# Patient Record
Sex: Male | Born: 1970 | Hispanic: Refuse to answer | Smoking: Never smoker
Health system: Southern US, Community
[De-identification: ages and names within clinical notes are randomized; demographics above are authoritative.]

## PROBLEM LIST (undated history)

## (undated) DIAGNOSIS — Z86718 Personal history of other venous thrombosis and embolism: Secondary | ICD-10-CM

## (undated) HISTORY — PX: BACK SURGERY: SHX140

---

## 2000-05-20 ENCOUNTER — Encounter: Admission: RE | Admit: 2000-05-20 | Discharge: 2000-05-20 | Payer: Self-pay | Admitting: Family Medicine

## 2000-05-20 ENCOUNTER — Encounter: Payer: Self-pay | Admitting: Family Medicine

## 2003-08-28 ENCOUNTER — Emergency Department (HOSPITAL_COMMUNITY): Admission: EM | Admit: 2003-08-28 | Discharge: 2003-08-28 | Payer: Self-pay | Admitting: Emergency Medicine

## 2007-03-06 ENCOUNTER — Emergency Department (HOSPITAL_COMMUNITY): Admission: EM | Admit: 2007-03-06 | Discharge: 2007-03-06 | Payer: Self-pay | Admitting: Emergency Medicine

## 2007-03-06 ENCOUNTER — Ambulatory Visit: Payer: Self-pay | Admitting: Psychiatry

## 2007-03-06 ENCOUNTER — Inpatient Hospital Stay (HOSPITAL_COMMUNITY): Admission: RE | Admit: 2007-03-06 | Discharge: 2007-03-09 | Payer: Self-pay | Admitting: Psychiatry

## 2010-01-12 ENCOUNTER — Emergency Department (HOSPITAL_COMMUNITY): Admission: EM | Admit: 2010-01-12 | Discharge: 2010-01-13 | Payer: Self-pay | Admitting: Emergency Medicine

## 2010-02-07 ENCOUNTER — Ambulatory Visit: Payer: Self-pay | Admitting: Diagnostic Radiology

## 2010-02-07 ENCOUNTER — Encounter: Payer: Self-pay | Admitting: Emergency Medicine

## 2010-02-08 ENCOUNTER — Ambulatory Visit: Payer: Self-pay | Admitting: Emergency Medicine

## 2010-02-08 ENCOUNTER — Inpatient Hospital Stay (HOSPITAL_COMMUNITY): Admission: EM | Admit: 2010-02-08 | Discharge: 2010-02-22 | Payer: Self-pay

## 2010-02-11 ENCOUNTER — Encounter: Payer: Self-pay | Admitting: Pulmonary Disease

## 2010-02-11 ENCOUNTER — Ambulatory Visit: Payer: Self-pay | Admitting: Vascular Surgery

## 2010-02-11 ENCOUNTER — Ambulatory Visit: Payer: Self-pay | Admitting: Infectious Diseases

## 2010-02-18 ENCOUNTER — Encounter (INDEPENDENT_AMBULATORY_CARE_PROVIDER_SITE_OTHER): Payer: Self-pay | Admitting: Internal Medicine

## 2010-03-14 ENCOUNTER — Inpatient Hospital Stay (HOSPITAL_COMMUNITY): Admission: AD | Admit: 2010-03-14 | Discharge: 2010-03-26 | Payer: Self-pay | Admitting: Plastic Surgery

## 2010-04-28 ENCOUNTER — Encounter (INDEPENDENT_AMBULATORY_CARE_PROVIDER_SITE_OTHER): Payer: Self-pay | Admitting: Nurse Practitioner

## 2010-05-02 ENCOUNTER — Encounter (INDEPENDENT_AMBULATORY_CARE_PROVIDER_SITE_OTHER): Payer: Self-pay | Admitting: Nurse Practitioner

## 2010-05-02 ENCOUNTER — Ambulatory Visit: Payer: Self-pay | Admitting: Internal Medicine

## 2010-05-04 ENCOUNTER — Encounter: Admission: RE | Admit: 2010-05-04 | Discharge: 2010-06-08 | Payer: Self-pay | Admitting: Orthopedic Surgery

## 2010-12-26 NOTE — Miscellaneous (Signed)
Summary: Medication update  Clinical Lists Changes  Spoke with pt's mother Arline Asp Pt had had several cancelled visits in this office an on 04/17/2010 he no showed Home health has been checking PT/INR and filling warfarin accordingly.  Medication were being filled in [facility] name because pt was to f/u in office with that provider. Home health ended this week Pt has completed his supply of medications [facility] 05[facility]01/2010 Reportedly pt is taking warfarin 5mg  - 3 tablets by mouth nightly [facility]ill [facility] for 15 tablets Advised mother that pt should keep this appt because technically he has not established with this office. He will ned to make a f/u appt with a provider Medications: Added new medication of WARFARIN SODIUM 5 MG TABS (WARFARIN SODIUM) 3 tablets by mouth nightly - Signed Rx of WARFARIN SODIUM 5 MG TABS (WARFARIN SODIUM) 3 tablets by mouth nightly;  #15 x 0;  Signed;  Entered by: Lehman Prom FNP;  Authorized by: Lehman Prom FNP;  Method used: Electronically to [facility]*, [address] [place], [place]  [address], Ph: [phone] or [phone], Fax: [phone]    Prescriptions: WARFARIN SODIUM 5 MG TABS (WARFARIN SODIUM) 3 tablets by mouth nightly  #15 x 0   Entered and Authorized by:   Lehman Prom FNP   Signed by:   Lehman Prom FNP on 04/28/2010   Method used:   Electronically to        [facility]* (retail)       [address] [place], [place]  [address]       Ph: [phone] or [phone]       Fax: [phone]   RxID:   [id number]

## 2010-12-26 NOTE — Letter (Signed)
Summary: ANTICOAGULATION DOSING  ANTICOAGULATION DOSING   Imported By: Arta Bruce 05/18/2010 11:22:13  _____________________________________________________________________  External Attachment:    Type:   Image     Comment:   External Document

## 2010-12-26 NOTE — Letter (Signed)
Summary: SOCIAL WOR CLINICAL ENCOUNTER  SOCIAL WOR CLINICAL ENCOUNTER   Imported By: Arta Bruce 05/26/2010 10:00:11  _____________________________________________________________________  External Attachment:    Type:   Image     Comment:   External Document

## 2011-02-13 LAB — DIFFERENTIAL
Basophils Absolute: 0 10*3/uL (ref 0.0–0.1)
Basophils Absolute: 0.1 10*3/uL (ref 0.0–0.1)
Basophils Absolute: 0.1 10*3/uL (ref 0.0–0.1)
Basophils Absolute: 0.1 10*3/uL (ref 0.0–0.1)
Basophils Absolute: 0.2 10*3/uL — ABNORMAL HIGH (ref 0.0–0.1)
Basophils Relative: 1 % (ref 0–1)
Basophils Relative: 1 % (ref 0–1)
Basophils Relative: 2 % — ABNORMAL HIGH (ref 0–1)
Eosinophils Absolute: 0.5 10*3/uL (ref 0.0–0.7)
Eosinophils Absolute: 0.5 10*3/uL (ref 0.0–0.7)
Eosinophils Absolute: 0.6 10*3/uL (ref 0.0–0.7)
Eosinophils Absolute: 0.6 10*3/uL (ref 0.0–0.7)
Eosinophils Absolute: 0.7 10*3/uL (ref 0.0–0.7)
Eosinophils Relative: 5 % (ref 0–5)
Eosinophils Relative: 6 % — ABNORMAL HIGH (ref 0–5)
Eosinophils Relative: 6 % — ABNORMAL HIGH (ref 0–5)
Eosinophils Relative: 6 % — ABNORMAL HIGH (ref 0–5)
Eosinophils Relative: 7 % — ABNORMAL HIGH (ref 0–5)
Lymphocytes Relative: 29 % (ref 12–46)
Lymphocytes Relative: 31 % (ref 12–46)
Lymphocytes Relative: 32 % (ref 12–46)
Lymphocytes Relative: 33 % (ref 12–46)
Lymphocytes Relative: 36 % (ref 12–46)
Lymphs Abs: 2.4 10*3/uL (ref 0.7–4.0)
Lymphs Abs: 2.6 10*3/uL (ref 0.7–4.0)
Lymphs Abs: 2.6 10*3/uL (ref 0.7–4.0)
Lymphs Abs: 2.6 10*3/uL (ref 0.7–4.0)
Lymphs Abs: 2.7 10*3/uL (ref 0.7–4.0)
Lymphs Abs: 3.2 10*3/uL (ref 0.7–4.0)
Monocytes Absolute: 0.6 10*3/uL (ref 0.1–1.0)
Monocytes Absolute: 0.5 10*3/uL (ref 0.1–1.0)
Monocytes Absolute: 0.6 10*3/uL (ref 0.1–1.0)
Monocytes Absolute: 0.6 10*3/uL (ref 0.1–1.0)
Monocytes Relative: 6 % (ref 3–12)
Monocytes Relative: 7 % (ref 3–12)
Monocytes Relative: 7 % (ref 3–12)
Monocytes Relative: 8 % (ref 3–12)
Neutro Abs: 4.5 10*3/uL (ref 1.7–7.7)
Neutro Abs: 3.8 10*3/uL (ref 1.7–7.7)
Neutro Abs: 4.4 10*3/uL (ref 1.7–7.7)
Neutro Abs: 4.8 10*3/uL (ref 1.7–7.7)
Neutro Abs: 5.5 10*3/uL (ref 1.7–7.7)
Neutrophils Relative %: 50 % (ref 43–77)
Neutrophils Relative %: 50 % (ref 43–77)
Neutrophils Relative %: 56 % (ref 43–77)
Neutrophils Relative %: 59 % (ref 43–77)

## 2011-02-13 LAB — ANAEROBIC CULTURE: Gram Stain: NONE SEEN

## 2011-02-13 LAB — CBC
HCT: 30.2 % — ABNORMAL LOW (ref 39.0–52.0)
HCT: 30.8 % — ABNORMAL LOW (ref 39.0–52.0)
HCT: 31.7 % — ABNORMAL LOW (ref 39.0–52.0)
HCT: 32.4 % — ABNORMAL LOW (ref 39.0–52.0)
HCT: 33.1 % — ABNORMAL LOW (ref 39.0–52.0)
HCT: 33.8 % — ABNORMAL LOW (ref 39.0–52.0)
HCT: 34.7 % — ABNORMAL LOW (ref 39.0–52.0)
Hemoglobin: 10.7 g/dL — ABNORMAL LOW (ref 13.0–17.0)
Hemoglobin: 10.9 g/dL — ABNORMAL LOW (ref 13.0–17.0)
Hemoglobin: 11 g/dL — ABNORMAL LOW (ref 13.0–17.0)
Hemoglobin: 11.3 g/dL — ABNORMAL LOW (ref 13.0–17.0)
Hemoglobin: 11.5 g/dL — ABNORMAL LOW (ref 13.0–17.0)
Hemoglobin: 11.8 g/dL — ABNORMAL LOW (ref 13.0–17.0)
MCHC: 33.9 g/dL (ref 30.0–36.0)
MCHC: 34 g/dL (ref 30.0–36.0)
MCHC: 34.2 g/dL (ref 30.0–36.0)
MCHC: 34.3 g/dL (ref 30.0–36.0)
MCHC: 34.9 g/dL (ref 30.0–36.0)
MCHC: 34.9 g/dL (ref 30.0–36.0)
MCV: 92.5 fL (ref 78.0–100.0)
MCV: 90.7 fL (ref 78.0–100.0)
MCV: 91.1 fL (ref 78.0–100.0)
MCV: 91.3 fL (ref 78.0–100.0)
MCV: 91.6 fL (ref 78.0–100.0)
MCV: 91.6 fL (ref 78.0–100.0)
MCV: 92 fL (ref 78.0–100.0)
MCV: 92.3 fL (ref 78.0–100.0)
Platelets: 261 10*3/uL (ref 150–400)
Platelets: 210 10*3/uL (ref 150–400)
Platelets: 216 10*3/uL (ref 150–400)
Platelets: 219 10*3/uL (ref 150–400)
Platelets: 221 10*3/uL (ref 150–400)
Platelets: 225 10*3/uL (ref 150–400)
Platelets: 232 10*3/uL (ref 150–400)
RBC: 3.61 MIL/uL — ABNORMAL LOW (ref 4.22–5.81)
RBC: 3.3 MIL/uL — ABNORMAL LOW (ref 4.22–5.81)
RBC: 3.38 MIL/uL — ABNORMAL LOW (ref 4.22–5.81)
RBC: 3.48 MIL/uL — ABNORMAL LOW (ref 4.22–5.81)
RBC: 3.53 MIL/uL — ABNORMAL LOW (ref 4.22–5.81)
RBC: 3.56 MIL/uL — ABNORMAL LOW (ref 4.22–5.81)
RBC: 3.71 MIL/uL — ABNORMAL LOW (ref 4.22–5.81)
RBC: 3.78 MIL/uL — ABNORMAL LOW (ref 4.22–5.81)
RDW: 13.9 % (ref 11.5–15.5)
RDW: 14.4 % (ref 11.5–15.5)
RDW: 14.6 % (ref 11.5–15.5)
RDW: 14.7 % (ref 11.5–15.5)
RDW: 14.9 % (ref 11.5–15.5)
RDW: 15 % (ref 11.5–15.5)
RDW: 15.4 % (ref 11.5–15.5)
RDW: 15.5 % (ref 11.5–15.5)
WBC: 9.1 10*3/uL (ref 4.0–10.5)
WBC: 7.7 10*3/uL (ref 4.0–10.5)
WBC: 7.9 10*3/uL (ref 4.0–10.5)
WBC: 8.3 10*3/uL (ref 4.0–10.5)
WBC: 8.3 10*3/uL (ref 4.0–10.5)
WBC: 8.5 10*3/uL (ref 4.0–10.5)
WBC: 9.3 10*3/uL (ref 4.0–10.5)

## 2011-02-13 LAB — BASIC METABOLIC PANEL
BUN: 2 mg/dL — ABNORMAL LOW (ref 6–23)
BUN: 4 mg/dL — ABNORMAL LOW (ref 6–23)
Calcium: 8.2 mg/dL — ABNORMAL LOW (ref 8.4–10.5)
Calcium: 8.6 mg/dL (ref 8.4–10.5)
Chloride: 106 mEq/L (ref 96–112)
Creatinine, Ser: 0.82 mg/dL (ref 0.4–1.5)
Creatinine, Ser: 0.88 mg/dL (ref 0.4–1.5)
GFR calc non Af Amer: >60 mL/min (ref >60)
GFR calc non Af Amer: >60 mL/min (ref >60)
Glucose, Bld: 97 mg/dL (ref 70–99)
Potassium: 3.6 mEq/L (ref 3.5–5.1)

## 2011-02-13 LAB — PROTIME-INR
INR: 1.04 (ref 0.00–1.49)
INR: 1.05 (ref 0.00–1.49)
INR: 1.13 (ref 0.00–1.49)
INR: 1.33 (ref 0.00–1.49)
Prothrombin Time: 13.6 seconds (ref 11.6–15.2)
Prothrombin Time: 15.2 seconds (ref 11.6–15.2)

## 2011-02-13 LAB — COMPREHENSIVE METABOLIC PANEL
ALT: 13 U/L (ref 0–53)
ALT: 15 U/L (ref 0–53)
AST: 14 U/L (ref 0–37)
AST: 17 U/L (ref 0–37)
Albumin: 2.2 g/dL — ABNORMAL LOW (ref 3.5–5.2)
Albumin: 2.8 g/dL — ABNORMAL LOW (ref 3.5–5.2)
Alkaline Phosphatase: 66 U/L (ref 39–117)
CO2: 31 mEq/L (ref 19–32)
Calcium: 8.7 mg/dL (ref 8.4–10.5)
Chloride: 105 mEq/L (ref 96–112)
GFR calc Af Amer: >60 mL/min (ref >60)
GFR calc Af Amer: >60 mL/min (ref >60)
GFR calc non Af Amer: >60 mL/min (ref >60)
Potassium: 3.7 mEq/L (ref 3.5–5.1)
Sodium: 137 mEq/L (ref 135–145)
Sodium: 138 mEq/L (ref 135–145)
Total Bilirubin: 0.4 mg/dL (ref 0.3–1.2)
Total Protein: 6.1 g/dL (ref 6.0–8.3)

## 2011-02-13 LAB — HEPARIN LEVEL (UNFRACTIONATED)
Heparin Unfractionated: 0.25 IU/mL — ABNORMAL LOW (ref 0.30–0.70)
Heparin Unfractionated: 0.14 IU/mL — ABNORMAL LOW (ref 0.30–0.70)
Heparin Unfractionated: 0.18 IU/mL — ABNORMAL LOW (ref 0.30–0.70)
Heparin Unfractionated: 0.24 IU/mL — ABNORMAL LOW (ref 0.30–0.70)
Heparin Unfractionated: 0.3 IU/mL (ref 0.30–0.70)
Heparin Unfractionated: 0.31 IU/mL (ref 0.30–0.70)
Heparin Unfractionated: 0.34 IU/mL (ref 0.30–0.70)
Heparin Unfractionated: 0.34 IU/mL (ref 0.30–0.70)
Heparin Unfractionated: 0.38 IU/mL (ref 0.30–0.70)
Heparin Unfractionated: 0.4 IU/mL (ref 0.30–0.70)
Heparin Unfractionated: 0.4 IU/mL (ref 0.30–0.70)

## 2011-02-13 LAB — APTT
aPTT: 29 seconds (ref 24–37)
aPTT: 30 seconds (ref 24–37)

## 2011-02-13 LAB — TISSUE CULTURE

## 2011-02-13 LAB — HEMOCCULT GUIAC POC 1CARD (OFFICE): Fecal Occult Bld: NEGATIVE

## 2011-02-14 LAB — URINALYSIS, ROUTINE W REFLEX MICROSCOPIC
Bilirubin Urine: NEGATIVE
Hgb urine dipstick: NEGATIVE
Nitrite: NEGATIVE
Protein, ur: NEGATIVE mg/dL
Specific Gravity, Urine: 1.03 (ref 1.005–1.030)
Urobilinogen, UA: 0.2 mg/dL (ref 0.0–1.0)

## 2011-02-19 LAB — CBC
HCT: 29 % — ABNORMAL LOW (ref 39.0–52.0)
HCT: 30.4 % — ABNORMAL LOW (ref 39.0–52.0)
HCT: 31.1 % — ABNORMAL LOW (ref 39.0–52.0)
HCT: 31.7 % — ABNORMAL LOW (ref 39.0–52.0)
HCT: 33.2 % — ABNORMAL LOW (ref 39.0–52.0)
HCT: 40.8 % (ref 39.0–52.0)
Hemoglobin: 10.1 g/dL — ABNORMAL LOW (ref 13.0–17.0)
Hemoglobin: 10.8 g/dL — ABNORMAL LOW (ref 13.0–17.0)
Hemoglobin: 11.2 g/dL — ABNORMAL LOW (ref 13.0–17.0)
Hemoglobin: 11.4 g/dL — ABNORMAL LOW (ref 13.0–17.0)
Hemoglobin: 14.3 g/dL (ref 13.0–17.0)
MCHC: 34.9 g/dL (ref 30.0–36.0)
MCHC: 35.1 g/dL (ref 30.0–36.0)
MCHC: 35.6 g/dL (ref 30.0–36.0)
MCV: 95.5 fL (ref 78.0–100.0)
MCV: 95.6 fL (ref 78.0–100.0)
MCV: 95.9 fL (ref 78.0–100.0)
MCV: 96.5 fL (ref 78.0–100.0)
MCV: 98.4 fL (ref 78.0–100.0)
Platelets: 121 10*3/uL — ABNORMAL LOW (ref 150–400)
Platelets: 121 10*3/uL — ABNORMAL LOW (ref 150–400)
Platelets: 16 10*3/uL — CL (ref 150–400)
Platelets: 182 10*3/uL (ref 150–400)
Platelets: 38 10*3/uL — ABNORMAL LOW (ref 150–400)
Platelets: 51 10*3/uL — ABNORMAL LOW (ref 150–400)
Platelets: 81 10*3/uL — ABNORMAL LOW (ref 150–400)
RBC: 3.11 MIL/uL — ABNORMAL LOW (ref 4.22–5.81)
RBC: 3.14 MIL/uL — ABNORMAL LOW (ref 4.22–5.81)
RBC: 3.32 MIL/uL — ABNORMAL LOW (ref 4.22–5.81)
RBC: 3.92 MIL/uL — ABNORMAL LOW (ref 4.22–5.81)
RBC: 4.27 MIL/uL (ref 4.22–5.81)
RDW: 13.2 % (ref 11.5–15.5)
RDW: 13.5 % (ref 11.5–15.5)
RDW: 13.9 % (ref 11.5–15.5)
RDW: 14.6 % (ref 11.5–15.5)
RDW: 14.7 % (ref 11.5–15.5)
RDW: 14.9 % (ref 11.5–15.5)
RDW: 15.2 % (ref 11.5–15.5)
WBC: 10.7 10*3/uL — ABNORMAL HIGH (ref 4.0–10.5)
WBC: 10.9 10*3/uL — ABNORMAL HIGH (ref 4.0–10.5)
WBC: 16.6 10*3/uL — ABNORMAL HIGH (ref 4.0–10.5)
WBC: 16.8 10*3/uL — ABNORMAL HIGH (ref 4.0–10.5)
WBC: 2.4 10*3/uL — ABNORMAL LOW (ref 4.0–10.5)
WBC: 9.3 10*3/uL (ref 4.0–10.5)

## 2011-02-19 LAB — PROTIME-INR
INR: 1.19 (ref 0.00–1.49)
INR: 1.56 — ABNORMAL HIGH (ref 0.00–1.49)
Prothrombin Time: 13.7 seconds (ref 11.6–15.2)
Prothrombin Time: 16.6 seconds — ABNORMAL HIGH (ref 11.6–15.2)
Prothrombin Time: 19.2 seconds — ABNORMAL HIGH (ref 11.6–15.2)

## 2011-02-19 LAB — BASIC METABOLIC PANEL
BUN: 12 mg/dL (ref 6–23)
BUN: 30 mg/dL — ABNORMAL HIGH (ref 6–23)
BUN: 31 mg/dL — ABNORMAL HIGH (ref 6–23)
BUN: 31 mg/dL — ABNORMAL HIGH (ref 6–23)
BUN: 33 mg/dL — ABNORMAL HIGH (ref 6–23)
BUN: 34 mg/dL — ABNORMAL HIGH (ref 6–23)
CO2: 18 mEq/L — ABNORMAL LOW (ref 19–32)
CO2: 18 mEq/L — ABNORMAL LOW (ref 19–32)
CO2: 21 mEq/L (ref 19–32)
CO2: 22 mEq/L (ref 19–32)
CO2: 24 mEq/L (ref 19–32)
Calcium: 5.9 mg/dL — CL (ref 8.4–10.5)
Calcium: 6 mg/dL — CL (ref 8.4–10.5)
Calcium: 7.8 mg/dL — ABNORMAL LOW (ref 8.4–10.5)
Calcium: 8.7 mg/dL (ref 8.4–10.5)
Calcium: 9.1 mg/dL (ref 8.4–10.5)
Chloride: 100 mEq/L (ref 96–112)
Chloride: 106 mEq/L (ref 96–112)
Chloride: 107 mEq/L (ref 96–112)
Chloride: 109 mEq/L (ref 96–112)
Chloride: 99 mEq/L (ref 96–112)
Creatinine, Ser: 1.31 mg/dL (ref 0.4–1.5)
Creatinine, Ser: 1.45 mg/dL (ref 0.4–1.5)
Creatinine, Ser: 1.47 mg/dL (ref 0.4–1.5)
Creatinine, Ser: 2.2 mg/dL — ABNORMAL HIGH (ref 0.4–1.5)
Creatinine, Ser: 2.31 mg/dL — ABNORMAL HIGH (ref 0.4–1.5)
Creatinine, Ser: 2.32 mg/dL — ABNORMAL HIGH (ref 0.4–1.5)
Creatinine, Ser: 3.1 mg/dL — ABNORMAL HIGH (ref 0.4–1.5)
GFR calc Af Amer: 38 mL/min — ABNORMAL LOW (ref >60)
GFR calc Af Amer: >60 mL/min (ref >60)
GFR calc Af Amer: >60 mL/min (ref >60)
GFR calc non Af Amer: 23 mL/min — ABNORMAL LOW (ref >60)
GFR calc non Af Amer: 32 mL/min — ABNORMAL LOW (ref >60)
GFR calc non Af Amer: 38 mL/min — ABNORMAL LOW (ref >60)
GFR calc non Af Amer: 54 mL/min — ABNORMAL LOW (ref >60)
GFR calc non Af Amer: 54 mL/min — ABNORMAL LOW (ref >60)
GFR calc non Af Amer: 54 mL/min — ABNORMAL LOW (ref >60)
GFR calc non Af Amer: >60 mL/min (ref >60)
GFR calc non Af Amer: >60 mL/min (ref >60)
GFR calc non Af Amer: >60 mL/min (ref >60)
GFR calc non Af Amer: >60 mL/min (ref >60)
Glucose, Bld: 117 mg/dL — ABNORMAL HIGH (ref 70–99)
Glucose, Bld: 120 mg/dL — ABNORMAL HIGH (ref 70–99)
Glucose, Bld: 128 mg/dL — ABNORMAL HIGH (ref 70–99)
Glucose, Bld: 140 mg/dL — ABNORMAL HIGH (ref 70–99)
Glucose, Bld: 154 mg/dL — ABNORMAL HIGH (ref 70–99)
Glucose, Bld: 156 mg/dL — ABNORMAL HIGH (ref 70–99)
Glucose, Bld: 80 mg/dL (ref 70–99)
Glucose, Bld: 84 mg/dL (ref 70–99)
Glucose, Bld: 85 mg/dL (ref 70–99)
Potassium: 2.8 mEq/L — ABNORMAL LOW (ref 3.5–5.1)
Potassium: 3 mEq/L — ABNORMAL LOW (ref 3.5–5.1)
Potassium: 3.4 mEq/L — ABNORMAL LOW (ref 3.5–5.1)
Potassium: 3.6 mEq/L (ref 3.5–5.1)
Potassium: 4 mEq/L (ref 3.5–5.1)
Potassium: 4 mEq/L (ref 3.5–5.1)
Sodium: 131 mEq/L — ABNORMAL LOW (ref 135–145)
Sodium: 133 mEq/L — ABNORMAL LOW (ref 135–145)
Sodium: 136 mEq/L (ref 135–145)
Sodium: 136 mEq/L (ref 135–145)
Sodium: 140 mEq/L (ref 135–145)
Sodium: 146 mEq/L — ABNORMAL HIGH (ref 135–145)

## 2011-02-19 LAB — CARBOXYHEMOGLOBIN
Carboxyhemoglobin: 1.3 % (ref 0.5–1.5)
Carboxyhemoglobin: 1.4 % (ref 0.5–1.5)
Carboxyhemoglobin: 1.4 % (ref 0.5–1.5)
Methemoglobin: 0.8 % (ref 0.0–1.5)
Methemoglobin: 0.9 % (ref 0.0–1.5)
O2 Saturation: 85.7 %
O2 Saturation: 89.1 %

## 2011-02-19 LAB — URINALYSIS, ROUTINE W REFLEX MICROSCOPIC
Bilirubin Urine: NEGATIVE
Glucose, UA: NEGATIVE mg/dL
Hgb urine dipstick: NEGATIVE
Ketones, ur: NEGATIVE mg/dL
Leukocytes, UA: NEGATIVE
Protein, ur: 30 mg/dL — AB
pH: 5.5 (ref 5.0–8.0)

## 2011-02-19 LAB — DIFFERENTIAL
Basophils Absolute: 0.1 10*3/uL (ref 0.0–0.1)
Basophils Relative: 1 % (ref 0–1)
Eosinophils Absolute: 0 10*3/uL (ref 0.0–0.7)
Lymphocytes Relative: 5 % — ABNORMAL LOW (ref 12–46)
Monocytes Absolute: 0.1 10*3/uL (ref 0.1–1.0)
Neutro Abs: 4.5 10*3/uL (ref 1.7–7.7)
Neutrophils Relative %: 91 % — ABNORMAL HIGH (ref 43–77)

## 2011-02-19 LAB — POCT I-STAT 3, ART BLOOD GAS (G3+)
Acid-base deficit: 1 mmol/L (ref 0.0–2.0)
Acid-base deficit: 3 mmol/L — ABNORMAL HIGH (ref 0.0–2.0)
Acid-base deficit: 3 mmol/L — ABNORMAL HIGH (ref 0.0–2.0)
Acid-base deficit: 7 mmol/L — ABNORMAL HIGH (ref 0.0–2.0)
Acid-base deficit: 9 mmol/L — ABNORMAL HIGH (ref 0.0–2.0)
Bicarbonate: 19.8 mEq/L — ABNORMAL LOW (ref 20.0–24.0)
Bicarbonate: 21.5 mEq/L (ref 20.0–24.0)
Bicarbonate: 21.7 mEq/L (ref 20.0–24.0)
Bicarbonate: 22.1 mEq/L (ref 20.0–24.0)
Bicarbonate: 34.6 mEq/L — ABNORMAL HIGH (ref 20.0–24.0)
O2 Saturation: 87 %
O2 Saturation: 92 %
O2 Saturation: 92 %
O2 Saturation: 94 %
O2 Saturation: 96 %
O2 Saturation: 97 %
O2 Saturation: 98 %
O2 Saturation: 98 %
Patient temperature: 102.4
Patient temperature: 104
Patient temperature: 99
Patient temperature: 99.1
Patient temperature: 99.5
TCO2: 17 mmol/L (ref 0–100)
TCO2: 19 mmol/L (ref 0–100)
TCO2: 20 mmol/L (ref 0–100)
TCO2: 21 mmol/L (ref 0–100)
TCO2: 21 mmol/L (ref 0–100)
TCO2: 22 mmol/L (ref 0–100)
TCO2: 23 mmol/L (ref 0–100)
TCO2: 29 mmol/L (ref 0–100)
pCO2 arterial: 32.1 mmHg — ABNORMAL LOW (ref 35.0–45.0)
pCO2 arterial: 32.2 mmHg — ABNORMAL LOW (ref 35.0–45.0)
pCO2 arterial: 34.4 mmHg — ABNORMAL LOW (ref 35.0–45.0)
pCO2 arterial: 36.2 mmHg (ref 35.0–45.0)
pCO2 arterial: 36.4 mmHg (ref 35.0–45.0)
pCO2 arterial: 37.7 mmHg (ref 35.0–45.0)
pCO2 arterial: 46.3 mmHg — ABNORMAL HIGH (ref 35.0–45.0)
pCO2 arterial: 53.8 mmHg — ABNORMAL HIGH (ref 35.0–45.0)
pCO2 arterial: 57.8 mmHg (ref 35.0–45.0)
pH, Arterial: 7.138 — CL (ref 7.350–7.450)
pH, Arterial: 7.168 — CL (ref 7.350–7.450)
pH, Arterial: 7.182 — CL (ref 7.350–7.450)
pH, Arterial: 7.251 — ABNORMAL LOW (ref 7.350–7.450)
pH, Arterial: 7.367 (ref 7.350–7.450)
pH, Arterial: 7.388 (ref 7.350–7.450)
pH, Arterial: 7.44 (ref 7.350–7.450)
pH, Arterial: 7.456 — ABNORMAL HIGH (ref 7.350–7.450)
pO2, Arterial: 101 mmHg — ABNORMAL HIGH (ref 80.0–100.0)
pO2, Arterial: 206 mmHg — ABNORMAL HIGH (ref 80.0–100.0)
pO2, Arterial: 49 mmHg — ABNORMAL LOW (ref 80.0–100.0)
pO2, Arterial: 50 mmHg — ABNORMAL LOW (ref 80.0–100.0)
pO2, Arterial: 70 mmHg — ABNORMAL LOW (ref 80.0–100.0)
pO2, Arterial: 89 mmHg (ref 80.0–100.0)

## 2011-02-19 LAB — PHOSPHORUS
Phosphorus: 3.3 mg/dL (ref 2.3–4.6)
Phosphorus: <1 mg/dL — CL (ref 2.3–4.6)

## 2011-02-19 LAB — PROTEIN S ACTIVITY: Protein S Activity: 78 % (ref 69–129)

## 2011-02-19 LAB — COMPREHENSIVE METABOLIC PANEL
ALT: 126 U/L — ABNORMAL HIGH (ref 0–53)
ALT: 46 U/L (ref 0–53)
ALT: 65 U/L — ABNORMAL HIGH (ref 0–53)
ALT: 89 U/L — ABNORMAL HIGH (ref 0–53)
AST: 39 U/L — ABNORMAL HIGH (ref 0–37)
AST: 74 U/L — ABNORMAL HIGH (ref 0–37)
Albumin: 1.8 g/dL — ABNORMAL LOW (ref 3.5–5.2)
Albumin: 2.1 g/dL — ABNORMAL LOW (ref 3.5–5.2)
Albumin: 2.4 g/dL — ABNORMAL LOW (ref 3.5–5.2)
Alkaline Phosphatase: 103 U/L (ref 39–117)
Alkaline Phosphatase: 12 U/L — ABNORMAL LOW (ref 39–117)
Alkaline Phosphatase: 50 U/L (ref 39–117)
BUN: 12 mg/dL (ref 6–23)
BUN: 28 mg/dL — ABNORMAL HIGH (ref 6–23)
BUN: 40 mg/dL — ABNORMAL HIGH (ref 6–23)
CO2: 14 mEq/L — ABNORMAL LOW (ref 19–32)
CO2: 20 mEq/L (ref 19–32)
CO2: 22 mEq/L (ref 19–32)
CO2: 24 mEq/L (ref 19–32)
Calcium: 6 mg/dL — CL (ref 8.4–10.5)
Calcium: 6.4 mg/dL — CL (ref 8.4–10.5)
Calcium: 8.4 mg/dL (ref 8.4–10.5)
Chloride: 108 mEq/L (ref 96–112)
Chloride: 109 mEq/L (ref 96–112)
Chloride: 98 mEq/L (ref 96–112)
Creatinine, Ser: 1.02 mg/dL (ref 0.4–1.5)
Creatinine, Ser: 2.41 mg/dL — ABNORMAL HIGH (ref 0.4–1.5)
GFR calc Af Amer: 36 mL/min — ABNORMAL LOW (ref >60)
GFR calc Af Amer: 38 mL/min — ABNORMAL LOW (ref >60)
GFR calc Af Amer: >60 mL/min (ref >60)
GFR calc non Af Amer: 30 mL/min — ABNORMAL LOW (ref >60)
GFR calc non Af Amer: 30 mL/min — ABNORMAL LOW (ref >60)
GFR calc non Af Amer: >60 mL/min (ref >60)
Glucose, Bld: 140 mg/dL — ABNORMAL HIGH (ref 70–99)
Glucose, Bld: 70 mg/dL (ref 70–99)
Glucose, Bld: 80 mg/dL (ref 70–99)
Potassium: 2.7 mEq/L — CL (ref 3.5–5.1)
Potassium: 3.2 mEq/L — ABNORMAL LOW (ref 3.5–5.1)
Potassium: 3.4 mEq/L — ABNORMAL LOW (ref 3.5–5.1)
Sodium: 130 mEq/L — ABNORMAL LOW (ref 135–145)
Sodium: 131 mEq/L — ABNORMAL LOW (ref 135–145)
Sodium: 134 mEq/L — ABNORMAL LOW (ref 135–145)
Sodium: 135 mEq/L (ref 135–145)
Total Bilirubin: 1.2 mg/dL (ref 0.3–1.2)
Total Bilirubin: 1.3 mg/dL — ABNORMAL HIGH (ref 0.3–1.2)
Total Bilirubin: 1.9 mg/dL — ABNORMAL HIGH (ref 0.3–1.2)
Total Protein: 4.2 g/dL — ABNORMAL LOW (ref 6.0–8.3)
Total Protein: 4.5 g/dL — ABNORMAL LOW (ref 6.0–8.3)
Total Protein: 4.8 g/dL — ABNORMAL LOW (ref 6.0–8.3)
Total Protein: 4.8 g/dL — ABNORMAL LOW (ref 6.0–8.3)
Total Protein: 5.5 g/dL — ABNORMAL LOW (ref 6.0–8.3)

## 2011-02-19 LAB — CK TOTAL AND CKMB (NOT AT ARMC)
CK, MB: 1.2 ng/mL (ref 0.3–4.0)
Relative Index: INVALID (ref 0.0–2.5)
Total CK: 98 U/L (ref 7–232)

## 2011-02-19 LAB — TYPE AND SCREEN
ABO/RH(D): O POS
Antibody Screen: NEGATIVE

## 2011-02-19 LAB — CULTURE, BLOOD (ROUTINE X 2)
Culture: NO GROWTH
Culture: NO GROWTH

## 2011-02-19 LAB — URINE MICROSCOPIC-ADD ON

## 2011-02-19 LAB — LACTIC ACID, PLASMA
Lactic Acid, Venous: 2.8 mmol/L — ABNORMAL HIGH (ref 0.5–2.2)
Lactic Acid, Venous: 2.8 mmol/L — ABNORMAL HIGH (ref 0.5–2.2)
Lactic Acid, Venous: 5.4 mmol/L — ABNORMAL HIGH (ref 0.5–2.2)

## 2011-02-19 LAB — LUPUS ANTICOAGULANT PANEL
DRVVT: 50.4 secs — ABNORMAL HIGH (ref 36.2–44.3)
PTT Lupus Anticoagulant: 43 secs (ref 32.0–43.4)

## 2011-02-19 LAB — HEPATIC FUNCTION PANEL
Albumin: 4.3 g/dL (ref 3.5–5.2)
Alkaline Phosphatase: 41 U/L (ref 39–117)
Indirect Bilirubin: 1.4 mg/dL — ABNORMAL HIGH (ref 0.3–0.9)
Total Bilirubin: 1.5 mg/dL — ABNORMAL HIGH (ref 0.3–1.2)
Total Protein: 7.4 g/dL (ref 6.0–8.3)

## 2011-02-19 LAB — CARDIOLIPIN ANTIBODIES, IGG, IGM, IGA
Anticardiolipin IgA: 21 APL U/mL (ref <22)
Anticardiolipin IgG: 14 GPL U/mL (ref <23)

## 2011-02-19 LAB — GLUCOSE, CAPILLARY
Glucose-Capillary: 109 mg/dL — ABNORMAL HIGH (ref 70–99)
Glucose-Capillary: 122 mg/dL — ABNORMAL HIGH (ref 70–99)
Glucose-Capillary: 139 mg/dL — ABNORMAL HIGH (ref 70–99)

## 2011-02-19 LAB — CULTURE, RESPIRATORY W GRAM STAIN

## 2011-02-19 LAB — URINE CULTURE: Culture: NO GROWTH

## 2011-02-19 LAB — PREPARE PLATELETS

## 2011-02-19 LAB — HIV-1 RNA QUANT-NO REFLEX-BLD
HIV 1 RNA Quant: <48 copies/mL (ref <48)
HIV-1 RNA Quant, Log: <1.68 {Log} (ref <1.68)

## 2011-02-19 LAB — DIC (DISSEMINATED INTRAVASCULAR COAGULATION)PANEL
D-Dimer, Quant: >20 ug/mL-FEU — ABNORMAL HIGH (ref 0.00–0.48)
Platelets: 105 10*3/uL — ABNORMAL LOW (ref 150–400)
aPTT: 46 seconds — ABNORMAL HIGH (ref 24–37)

## 2011-02-19 LAB — MAGNESIUM
Magnesium: 0.8 mg/dL — CL (ref 1.5–2.5)
Magnesium: 1.5 mg/dL (ref 1.5–2.5)
Magnesium: 1.8 mg/dL (ref 1.5–2.5)
Magnesium: 2.1 mg/dL (ref 1.5–2.5)

## 2011-02-19 LAB — CK: Total CK: 299 U/L — ABNORMAL HIGH (ref 7–232)

## 2011-02-19 LAB — PROTEIN C ACTIVITY: Protein C Activity: 106 % (ref 75–133)

## 2011-02-19 LAB — CALCIUM, IONIZED
Calcium, Ion: 0.97 mmol/L — ABNORMAL LOW (ref 1.12–1.32)
Calcium, Ion: 1.06 mmol/L — ABNORMAL LOW (ref 1.12–1.32)

## 2011-02-19 LAB — ABO/RH: ABO/RH(D): O POS

## 2011-02-19 LAB — APTT: aPTT: 45 seconds — ABNORMAL HIGH (ref 24–37)

## 2011-02-19 LAB — FACTOR 5 LEIDEN

## 2011-02-19 LAB — HOMOCYSTEINE: Homocysteine: 6.6 umol/L (ref 4.0–15.4)

## 2011-02-19 LAB — PROTHROMBIN GENE MUTATION

## 2011-02-28 ENCOUNTER — Emergency Department (HOSPITAL_COMMUNITY)
Admission: EM | Admit: 2011-02-28 | Discharge: 2011-03-01 | Disposition: A | Discharge status: Home / Self Care | Payer: Self-pay | Attending: Emergency Medicine | Admitting: Emergency Medicine

## 2011-02-28 DIAGNOSIS — R45851 Suicidal ideations: Secondary | ICD-10-CM | POA: Insufficient documentation

## 2011-02-28 DIAGNOSIS — F1010 Alcohol abuse, uncomplicated: Secondary | ICD-10-CM | POA: Insufficient documentation

## 2011-02-28 LAB — CBC
HCT: 48.3 % (ref 39.0–52.0)
MCHC: 34.2 g/dL (ref 30.0–36.0)
MCV: 93.2 fL (ref 78.0–100.0)
RDW: 13.3 % (ref 11.5–15.5)

## 2011-02-28 LAB — ETHANOL
Alcohol, Ethyl (B): 210 mg/dL — ABNORMAL HIGH (ref 0–10)
Alcohol, Ethyl (B): 43 mg/dL — ABNORMAL HIGH (ref 0–10)

## 2011-02-28 LAB — DIFFERENTIAL
Basophils Absolute: 0 10*3/uL (ref 0.0–0.1)
Eosinophils Absolute: 0.1 10*3/uL (ref 0.0–0.7)
Eosinophils Relative: 1 % (ref 0–5)
Lymphocytes Relative: 37 % (ref 12–46)
Lymphs Abs: 3.3 10*3/uL (ref 0.7–4.0)
Monocytes Absolute: 0.4 10*3/uL (ref 0.1–1.0)

## 2011-02-28 LAB — BASIC METABOLIC PANEL
GFR calc non Af Amer: >60 mL/min (ref >60)
Potassium: 4.1 mEq/L (ref 3.5–5.1)
Sodium: 136 mEq/L (ref 135–145)

## 2011-02-28 LAB — RAPID URINE DRUG SCREEN, HOSP PERFORMED: Tetrahydrocannabinol: NOT DETECTED

## 2011-03-04 ENCOUNTER — Emergency Department (HOSPITAL_COMMUNITY)
Admission: EM | Admit: 2011-03-04 | Discharge: 2011-03-04 | Disposition: A | Discharge status: Home / Self Care | Payer: Self-pay | Attending: Emergency Medicine | Admitting: Emergency Medicine

## 2011-03-04 ENCOUNTER — Emergency Department (HOSPITAL_COMMUNITY): Payer: Self-pay

## 2011-03-04 DIAGNOSIS — Z86718 Personal history of other venous thrombosis and embolism: Secondary | ICD-10-CM | POA: Insufficient documentation

## 2011-03-04 DIAGNOSIS — R0602 Shortness of breath: Secondary | ICD-10-CM | POA: Insufficient documentation

## 2011-03-04 DIAGNOSIS — M79609 Pain in unspecified limb: Secondary | ICD-10-CM

## 2011-03-04 LAB — DIFFERENTIAL
Eosinophils Absolute: 0.2 10*3/uL (ref 0.0–0.7)
Lymphocytes Relative: 28 % (ref 12–46)
Lymphs Abs: 1.5 10*3/uL (ref 0.7–4.0)
Neutrophils Relative %: 61 % (ref 43–77)

## 2011-03-04 LAB — APTT: aPTT: 27 seconds (ref 24–37)

## 2011-03-04 LAB — CBC
HCT: 42.3 % (ref 39.0–52.0)
Hemoglobin: 14.8 g/dL (ref 13.0–17.0)
MCH: 32.5 pg (ref 26.0–34.0)
MCHC: 35 g/dL (ref 30.0–36.0)
MCV: 93 fL (ref 78.0–100.0)
Platelets: 149 10*3/uL — ABNORMAL LOW (ref 150–400)
RBC: 4.55 MIL/uL (ref 4.22–5.81)
RDW: 13.2 % (ref 11.5–15.5)
WBC: 5.3 10*3/uL (ref 4.0–10.5)

## 2011-03-04 LAB — POCT I-STAT, CHEM 8
BUN: 11 mg/dL (ref 6–23)
Calcium, Ion: 1.21 mmol/L (ref 1.12–1.32)
Chloride: 108 mEq/L (ref 96–112)
Creatinine, Ser: 1.2 mg/dL (ref 0.4–1.5)
Glucose, Bld: 75 mg/dL (ref 70–99)
HCT: 44 % (ref 39.0–52.0)
Hemoglobin: 15 g/dL (ref 13.0–17.0)
Potassium: 3.8 mEq/L (ref 3.5–5.1)
Sodium: 141 mEq/L (ref 135–145)
TCO2: 25 mmol/L (ref 0–100)

## 2011-03-04 MED ORDER — IOHEXOL 300 MG/ML  SOLN: 100.0000 mL | Freq: Once | INTRAMUSCULAR | Status: DC | PRN

## 2011-04-05 IMAGING — CR DG CHEST 1V PORT
1 series · 1 of 1 positions shown · non-contrast
Comparison: 0715 hours the same day and earlier.

CLINICAL DATA: 38-year-old male with shock.  Shortness of breath.

PORTABLE CHEST - 1 VIEW

[view not recorded]
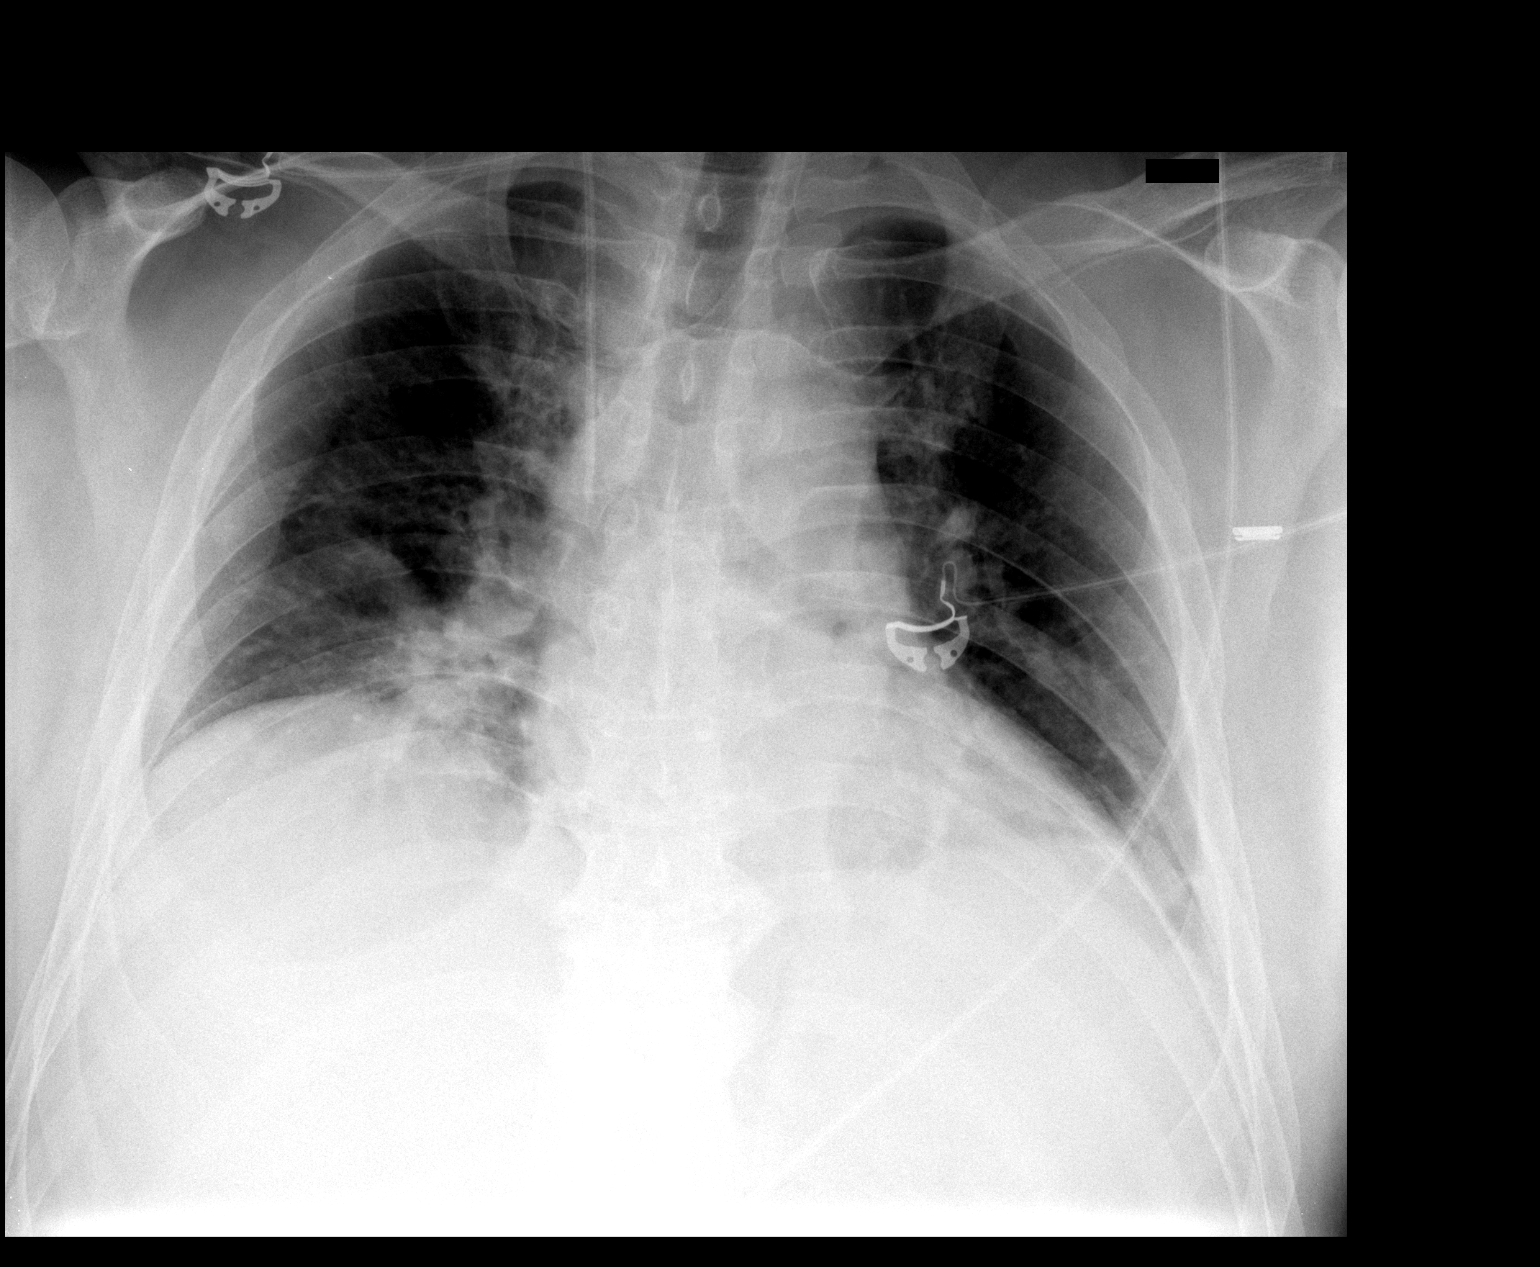

[1 of 1 positions shown; findings below may reference images not displayed]

FINDINGS: Portable semi upright AP view 1100 hours.  Stable right
IJ catheter.  Some interval improvement in lung volumes.  Continued
patchy bibasilar opacity.  No pneumothorax or large effusion. No
pulmonary edema.  Stable cardiac size and mediastinal contours.
Tubing is looped over the thoracic inlet, presumably external.
IMPRESSION: Mildly improved lung volumes with ongoing patchy bibasilar opacity.

## 2011-04-05 IMAGING — CR DG CHEST 1V PORT
1 series · 1 of 1 positions shown · non-contrast
Comparison: 02/08/2010 and earlier.

CLINICAL DATA: 38-year-old male with sepsis, shock.

PORTABLE CHEST - 1 VIEW

[view not recorded]
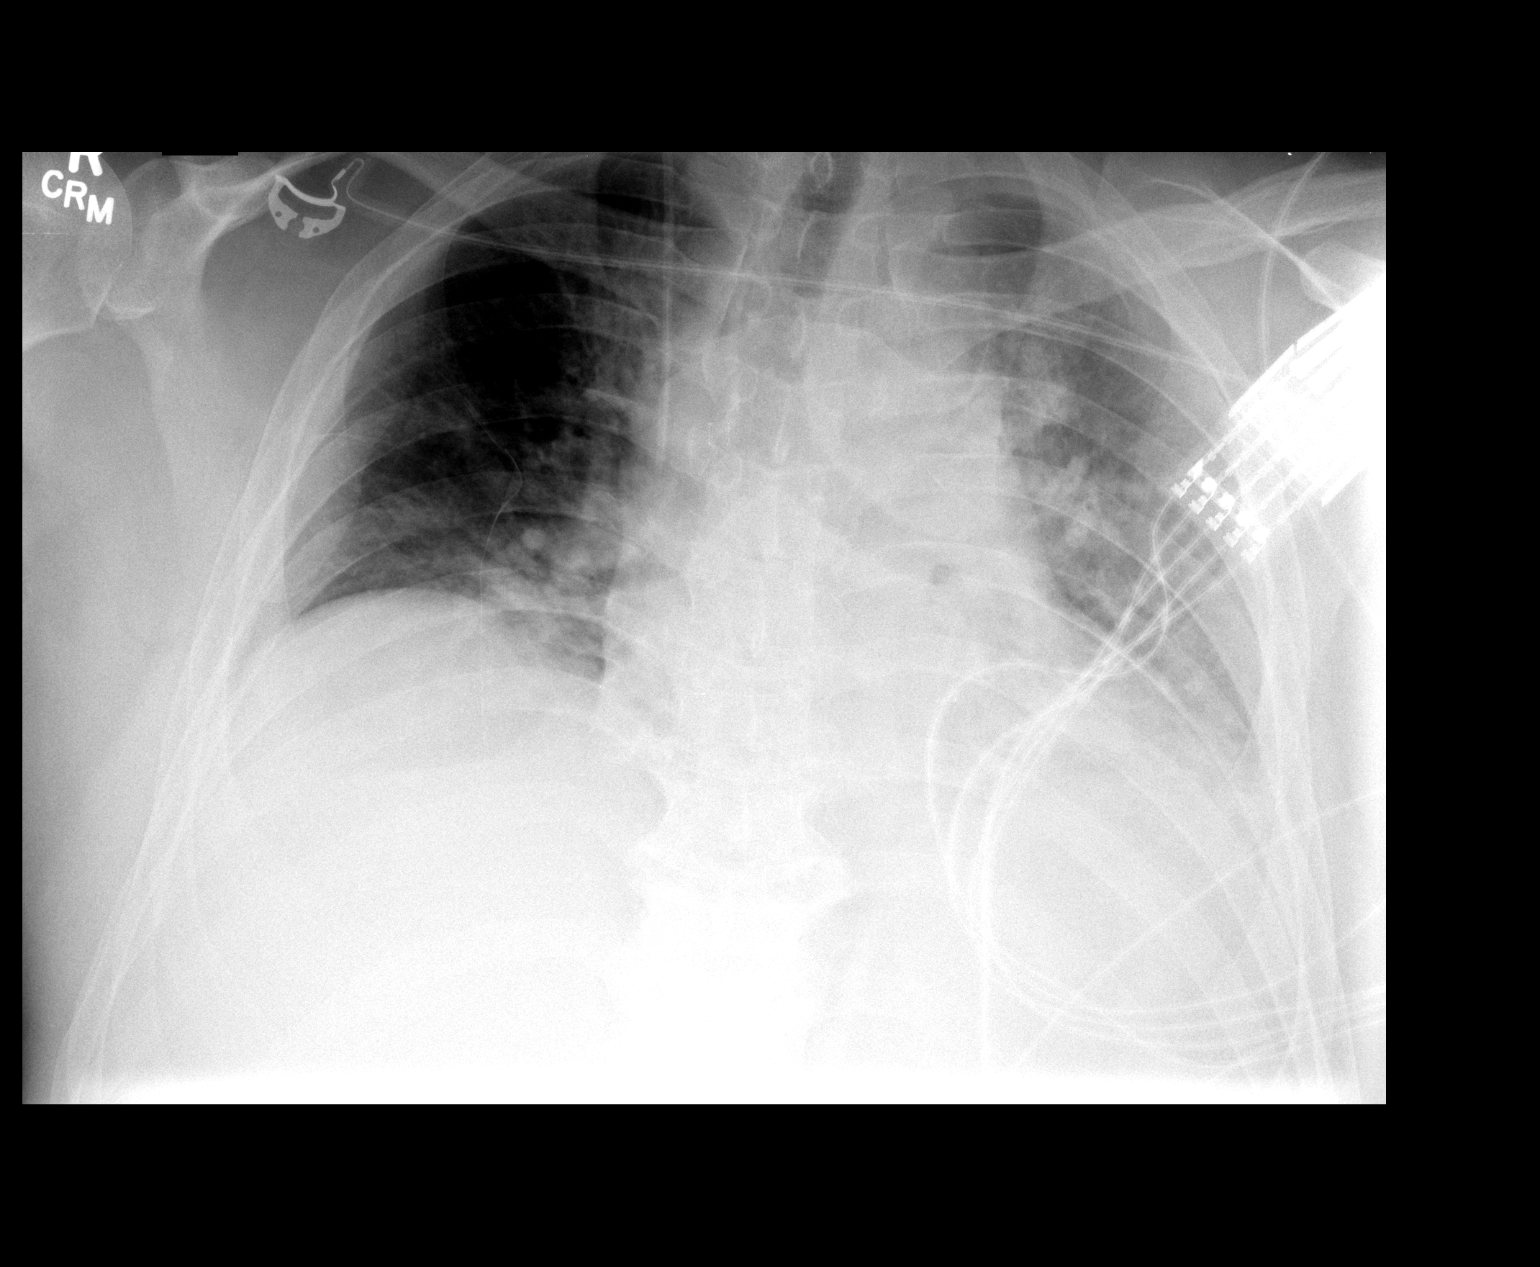

[1 of 1 positions shown; findings below may reference images not displayed]

FINDINGS: Portable semi upright AP view 5900 hours.  Stable right
IJ catheter.  Lower lung volumes and decreased bibasilar
ventilation.  No pneumothorax, pulmonary edema or large effusion.
Stable cardiac size and mediastinal contours.
IMPRESSION: Lower lung volumes and decreased bibasilar ventilation.

## 2011-04-06 IMAGING — CR DG CHEST 1V PORT
1 series · 1 of 1 positions shown · non-contrast
Comparison: Same day

CLINICAL DATA: Endotracheal placement.

PORTABLE CHEST - 1 VIEW

[view not recorded]
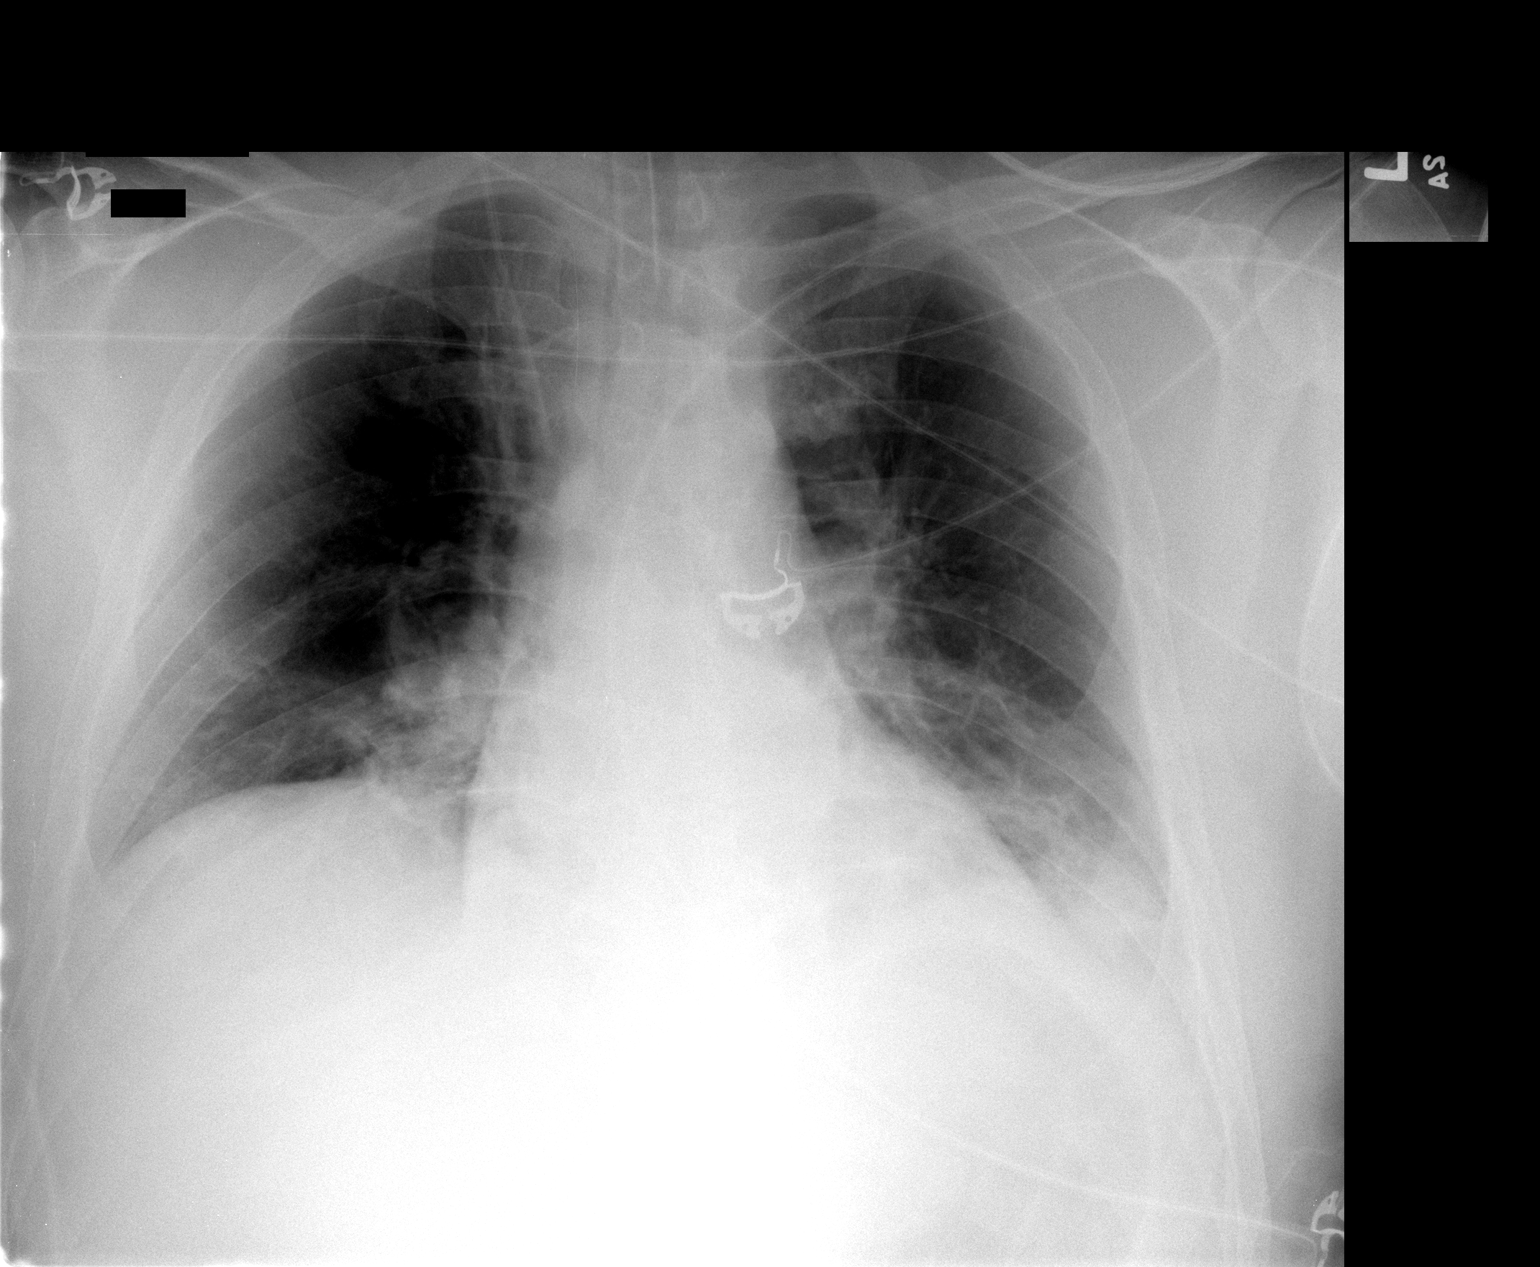

[1 of 1 positions shown; findings below may reference images not displayed]

FINDINGS: Endotracheal tube has its tip 7 cm above the carina.
Right internal jugular central line has its tip in the SVC at the
azygos level.  Atelectasis/infiltrate in both lower lobes persists,
little changed allowing for technical factors.  No new finding
otherwise.
IMPRESSION: Endotracheal tube has its tip 7 cm above the carina.

## 2011-04-06 IMAGING — CR DG ABD PORTABLE 1V
1 series · 1 of 1 positions shown · non-contrast
Comparison: Abdomen film of 01/12/2010

CLINICAL DATA: Feeding tube placement

ABDOMEN - 1 VIEW

[view not recorded]
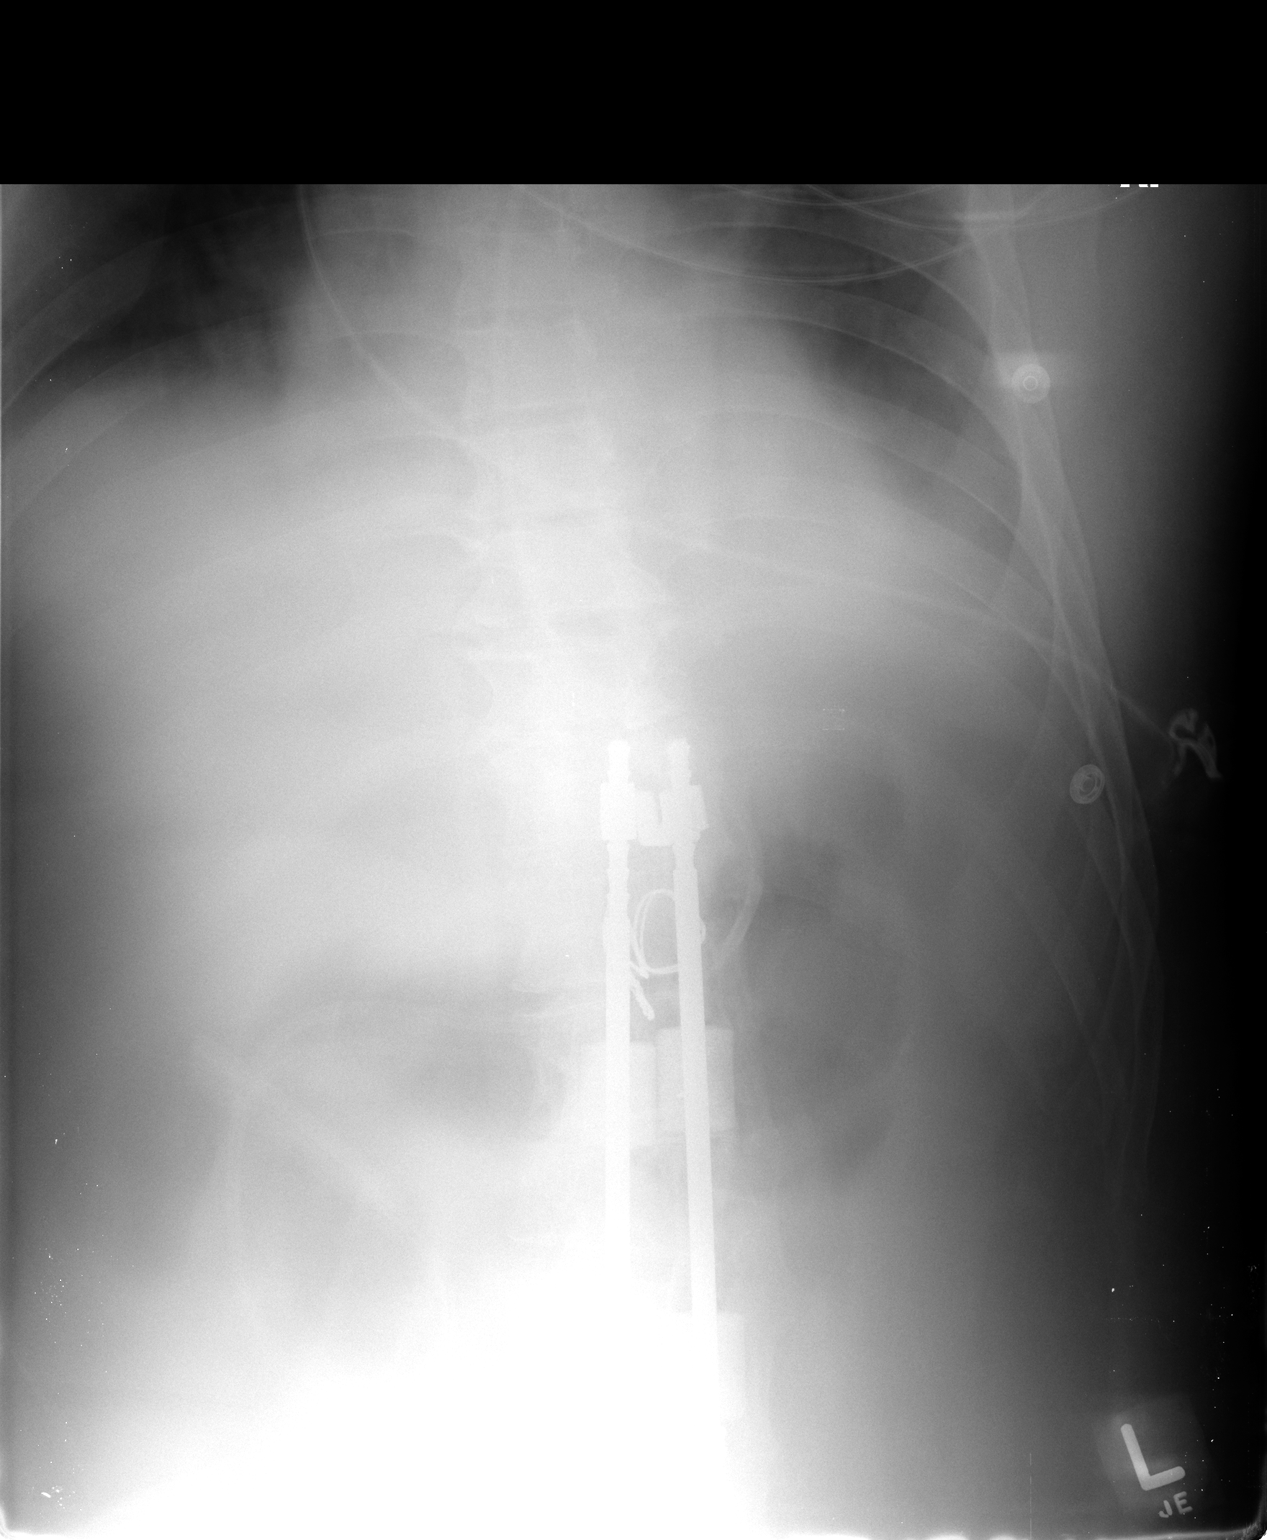

[1 of 1 positions shown; findings below may reference images not displayed]

FINDINGS: Respiratory motion obscures detail.  However the feeding
tube appears to be looped in the region of the descending duodenum.
Harrington rod fixation of the lumbar spine is noted.
IMPRESSION: Respiratory motion obscures detail but the feeding tube appears
looped in the region of the descending duodenum.

## 2011-04-08 IMAGING — CR DG CHEST 1V PORT
1 series · 1 of 1 positions shown · non-contrast
Comparison: the previous day's study

CLINICAL DATA: Fever and shock

PORTABLE CHEST - 1 VIEW

[AP]
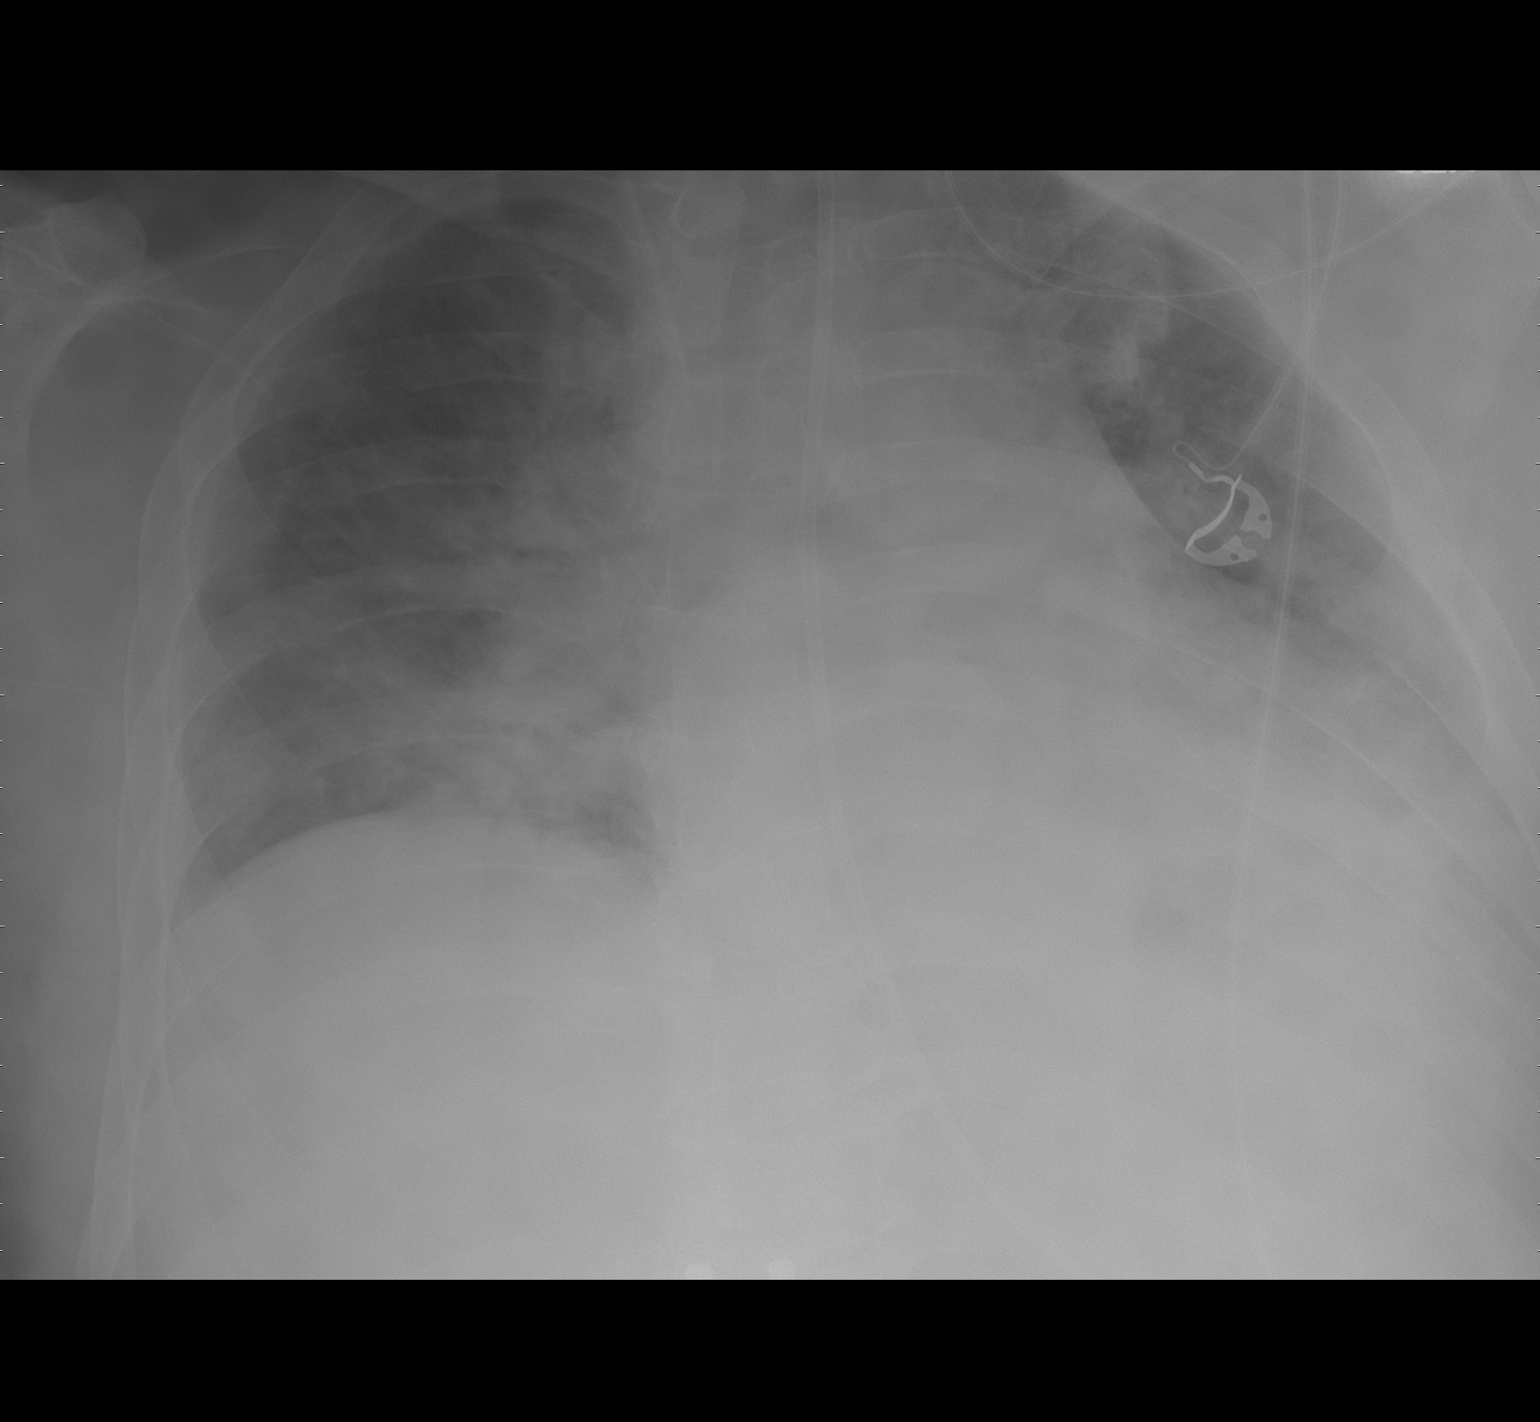

[1 of 1 positions shown; findings below may reference images not displayed]

FINDINGS: Endotracheal tube, feeding tube, and right IJ central
line are stable in position.  Low lung volumes with patchy
perihilar and bibasilar airspace opacities appearing slightly
increased since previous exam.  Heart size upper limits normal for
technique.  No definite effusion.
IMPRESSION: 1.  Some increase in perihilar and bibasilar airspace opacities.
2. Support hardware stable in position.

## 2011-04-13 NOTE — Discharge Summary (Signed)
NAMEBANKS, CHAIKIN NO.:  [id number]   MEDICAL RECORD NO.:  [id number]          PATIENT TYPE:  IPS   LOCATION:  0306                          FACILITY:  [facility]   PHYSICIAN:  Jasmine Pang, M.D. DATE OF BIRTH:  February 08, 1971   DATE OF ADMISSION:  03/06/2007  DATE OF DISCHARGE:  03/09/2007                               DISCHARGE SUMMARY   IDENTIFYING INFORMATION:  A 41 year old married white male who was  admitted on a voluntary basis on March 06, 2007.   HISTORY OF PRESENT ILLNESS:  The patient has a history of alcohol abuse.  He has been drinking daily, 10-12 beers and 1 to 2 bottles of wine.  He  wants to get it under control.  He states he wants to decrease the  quantity and frequency.  He drinks the wine frequently on weekends and  beer at sporting events.  He states he thought he was going to have a  nervous breakdown on the day before admission.  He was afraid he was  going to lose his job because he had gotten a DUI and his job involves  driving a company car.  This is the first [facility] admission for this  patient.   PAST MEDICAL HISTORY:  The patient has no acute chronic or health  problems.   MEDICATIONS:  No medications.   ALLERGIES:  No known drug allergies.   PHYSICAL EXAMINATION:  The patient's physical exam was done in the ED.  There were no acute medical or physical problems.  He was in no  distress.   ADMISSION LABORATORIES:  These were done in the ED prior to admission  and reviewed by the ED physician.   HOSPITAL COURSE:  The patient was placed on a Librium detox protocol.  He was also placed on trazodone 50 mg p.o. nightly p.r.n., may repeat  x1.  He was placed on a Xylocaine patch applied 12 hours off and 12  hours on for his back pain, and given Robaxin 750 mg p.o. q.i.d. p.r.n.  pain.  The patient tolerated his medications well with no significant  side effects.   Upon first meeting the patient, he stated he was here because he  wanted  to cut back on his drinking.  He had been very depressed and anxious on  the day before admission.  He had a DUI the day before that and was very  frightened he may lose his job.  He works at [employer].  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  His sleep was poor.  Appetite  fair.  Mood was depressed and anxious.  There was positive suicidal  ideation the day before admission but not on the day of admission.  On  March 08, 2007, the patient was upset.  He states he felt like crying.  He states he talked to his boss who had found out about the DUI.  He did  not feel that his boss was going to let  him keep his job, though this  was not overtly said.  He is also upset because he found that his wife  had been put in jail on drug -related charges.  He was very tearful.  Later that day, his mood improved and he was able to participate  appropriately in unit therapeutic groups and activities.   On March 09, 2007, the patient's mental status had improved markedly  from admission.  He tolerated the Librium detox protocol well with no  withdrawal symptoms.  He was friendly and cooperative with good eye  contact.  Speech was normal rate and flow.  Psychomotor activity was  within normal limits.  Mood was euthymic.  Affect wide range.  There was  no suicidal or homicidal ideation.  No thoughts of self-injurious  behavior.  No auditory or visual hallucinations.  No paranoia or  delusions.  Thoughts were logical and goal directed.  Thought content:  No predominant theme and cognitive was grossly back to baseline.  The  patient had talked to his wife this morning when she came to visit him  for breakfast.  He felt much better after seeing her and noted she  looked good.  They both had decided to try to get help for their  problems and try to move on from these mistakes that have happened  recently.  He seemed  invested in continuing a rehab program and he was  felt to be safe to be discharged today.   DISCHARGE DIAGNOSES:  AXIS I: Alcohol dependence.  AXIS II:  Depressive disorder, not otherwise specified.  AXIS III:  None.  AXIS IV:  Severe (problems with primary support group, problems related  to social environment, occupational problem, economic problem, problems  related to legal system, problems related to his substance dependence).  AXIS V:  Global assessment of functioning upon discharge was 55.  Global  assessment of functioning upon admission was 40.  Global assessment of  functioning highest past year 65 to 70.   DISCHARGE PLANS:  There were no specific activity level or dietary  restrictions.   DISCHARGE MEDICATIONS:  None.   POST HOSPITAL CARE PLANS:  The patient will go to the [facility] as a  walk-in, Monday through Friday from 9 to 5, to get a substance abuse  counselor.  He also expressed some interest in going to the [facility]  program and I told him I would refer this to the case manager.      Jasmine Pang, M.D.  Electronically Signed     [facility]/MEDQ  D:  03/09/2007  T:  03/10/2007  Job:  [id number]

## 2014-04-06 ENCOUNTER — Encounter (HOSPITAL_COMMUNITY): Payer: Self-pay | Admitting: Emergency Medicine

## 2014-04-06 ENCOUNTER — Emergency Department (HOSPITAL_COMMUNITY)
Admission: EM | Admit: 2014-04-06 | Discharge: 2014-04-06 | Disposition: A | Discharge status: Home / Self Care | Payer: Self-pay | Attending: Emergency Medicine | Admitting: Emergency Medicine

## 2014-04-06 DIAGNOSIS — Z86718 Personal history of other venous thrombosis and embolism: Secondary | ICD-10-CM | POA: Insufficient documentation

## 2014-04-06 DIAGNOSIS — M7989 Other specified soft tissue disorders: Secondary | ICD-10-CM

## 2014-04-06 DIAGNOSIS — Z79899 Other long term (current) drug therapy: Secondary | ICD-10-CM | POA: Insufficient documentation

## 2014-04-06 DIAGNOSIS — IMO0002 Reserved for concepts with insufficient information to code with codable children: Secondary | ICD-10-CM | POA: Insufficient documentation

## 2014-04-06 DIAGNOSIS — Z7901 Long term (current) use of anticoagulants: Secondary | ICD-10-CM | POA: Insufficient documentation

## 2014-04-06 HISTORY — DX: Personal history of other venous thrombosis and embolism: Z86.718

## 2014-04-06 NOTE — ED Notes (Signed)
Pt c/o left leg swelling, has hx of blood clot 2 years ago. C/o slight pain is more with walking on it.

## 2014-04-06 NOTE — Progress Notes (Signed)
[facility] provided pt with a list of primary care resources and a [facility] [employer] Card application to help patient establish primary care.  °

## 2014-04-06 NOTE — Discharge Instructions (Signed)
Elevate left leg.   Try not to stand too long.   Take tylenol, motrin for pain.   Follow up with your doctor.   Return to ER if you have worse pain or swelling, shortness of breath, chest pain.

## 2014-04-06 NOTE — Progress Notes (Signed)
VASCULAR LAB PRELIMINARY  PRELIMINARY  PRELIMINARY  PRELIMINARY  Left lower extremity venous duplex completed.    Preliminary report:  Left:  No evidence of acute DVT, superficial thrombosis, or Baker's cyst.  Anthony Haynes, RVT 04/06/2014, 3:25 PM

## 2014-04-06 NOTE — ED Provider Notes (Signed)
CSN: [id number]     Arrival date & time 04/06/14  1218 History   First MD Initiated Contact with Patient 04/06/14 1320     Chief Complaint  Patient presents with  . Leg Swelling    left     (Consider location/radiation/quality/duration/timing/severity/associated sxs/prior Treatment) The history is provided by the patient.  Anthony Haynes is a 43 y.o. male hx of L leg DVT off coumadin here with left leg swelling. He was on his feet a lot last several days and noticed left leg was more swollen than usual. Denies chest pain or shortness of breath. He had 2 blood clots in his left leg previously but he took himself off of Coumadin a year ago. Denies fever or chills.    Past Medical History  Diagnosis Date  . H/O blood clots left leg    2013   Past Surgical History  Procedure Laterality Date  . Back surgery     No family history on file. History  Substance Use Topics  . Smoking status: Never Smoker   . Smokeless tobacco: Not on file  . Alcohol Use: No    Review of Systems  Musculoskeletal:       L leg pain and swelling   All other systems reviewed and are negative.     Allergies  No known allergies  Home Medications   Prior to Admission medications   Medication Sig Start Date End Date Taking? Authorizing Provider  ALPRAZolam Prudy Feeler(XANAX) 1 MG tablet Take 1 mg by mouth 3 (three) times daily as needed for anxiety.   Yes Historical Provider, MD  hydrocortisone cream (CORTAID MAXIMUM STRENGTH) 1 % Apply 1 application topically daily. For ezcema   Yes Historical Provider, MD  ibuprofen (ADVIL,MOTRIN) 200 MG tablet Take 800 mg by mouth every 6 (six) hours as needed for mild pain.   Yes Historical Provider, MD   BP 121/83  Pulse 79  Temp(Src) 97.7 F (36.5 C) (Oral)  Resp 16  SpO2 94% Physical Exam  Nursing note and vitals reviewed. Constitutional: He is oriented to person, place, and time. He appears well-developed and well-nourished.  HENT:  Head: Normocephalic.  Eyes:  Conjunctivae are normal. Pupils are equal, round, and reactive to light.  Neck: Normal range of motion. Neck supple.  Cardiovascular: Normal rate, regular rhythm and normal heart sounds.   Pulmonary/Chest: Effort normal and breath sounds normal. No respiratory distress. He has no wheezes. He has no rales.  Abdominal: Soft. Bowel sounds are normal. He exhibits no distension. There is no tenderness. There is no rebound and no guarding.  Musculoskeletal: Normal range of motion.  L leg more swollen compared to right. Nl pulses. Mild calf tenderness. Small cracked skin L heel but no obvious cellulitis   Neurological: He is alert and oriented to person, place, and time.  Skin: Skin is warm and dry.  Psychiatric: He has a normal mood and affect. His behavior is normal. Judgment and thought content normal.    ED Course  Procedures (including critical care time) Labs Review Labs Reviewed - No data to display  Imaging Review No results found.   EKG Interpretation None      MDM   Final diagnoses:  None    Anthony Haynes is a 43 y.o. male here with L leg swelling. Vascular duplex showed no L DVT. Likely venous insufficiency. Recommend leg elevation, tylenol, motrin.     Richardean Canalavid H Yao, MD 04/06/14 1537

## 2014-04-06 NOTE — ED Notes (Addendum)
Per patient- has had leg swelling preventing ambulation starting four days ago. Says "the other night I couldn't even get out of the bed. It's worse when I'm on my feet all night." Was on Warfarin and Lovenox but stopped taking medications a year ago. Currently not taking any blood thinners. Hx of blood clots-last in 2013. First clot in hip and second in left lower leg. C/o pain in left lower leg and pain increases when pt flexes left foot. Also has cut on left heel. Reports he was doing yard work. Unsure of last tetanus shot. Denies F, N, D, chills. 1+ pedal pulses on bilateral feet and left popliteal.
